# Patient Record
Sex: Male | Born: 1960 | Race: White | Marital: Married | Smoking: Never smoker
Health system: Northeastern US, Community
[De-identification: ages and names within clinical notes are randomized; demographics above are authoritative.]

## PROBLEM LIST (undated history)

## (undated) HISTORY — PX: VARICOSE VEIN SURGERY: SHX832

## (undated) HISTORY — PX: CYSTOSCOPY W/ LASER LITHOTRIPSY: SHX1425

---

## 2020-02-19 ENCOUNTER — Encounter (HOSPITAL_BASED_OUTPATIENT_CLINIC_OR_DEPARTMENT_OTHER): Payer: Self-pay | Admitting: Internal Medicine

## 2020-02-19 ENCOUNTER — Ambulatory Visit: Payer: No Typology Code available for payment source | Attending: Internal Medicine | Admitting: Internal Medicine

## 2020-02-19 DIAGNOSIS — N23 Unspecified renal colic: Secondary | ICD-10-CM | POA: Insufficient documentation

## 2020-02-19 DIAGNOSIS — L989 Disorder of the skin and subcutaneous tissue, unspecified: Secondary | ICD-10-CM

## 2020-02-19 DIAGNOSIS — R011 Cardiac murmur, unspecified: Secondary | ICD-10-CM | POA: Insufficient documentation

## 2020-02-19 DIAGNOSIS — H5213 Myopia, bilateral: Secondary | ICD-10-CM | POA: Diagnosis not present

## 2020-02-19 DIAGNOSIS — Z0000 Encounter for general adult medical examination without abnormal findings: Secondary | ICD-10-CM | POA: Diagnosis not present

## 2020-02-19 NOTE — Assessment & Plan Note (Signed)
-   bring in for PE

## 2020-02-19 NOTE — Assessment & Plan Note (Signed)
-   will bring in for PE

## 2020-02-19 NOTE — Progress Notes (Signed)
PRIMARY CARE NOTE - NEW PATIENT      SUBJECTIVE  Joseph Garrison is a 59 year old [place] ([place])-speaking male patient who presents for a new patient visit. Today we discussed the following issue(s):    Problem List     Skin lesion     02/19/2020: lesion on arm and eyelid. First noticed last year. Eyelid lesions with "a bit of yellow flesh in there". One lesion has shrunk, the other lesions are not changing. Has family history of skin cancer.            Kidney pain     02/19/2020: h/o kidney stones. Pain right flank for the past 5 years. No radiation. Saw urologist in [place]. Had stone removal procedure 3 years ago. Improves with drinking water--drinks 3L per day. No gross hematuria.            Heart murmur     02/19/2020: noted in childhood. Brother and daughter have both been noted to have heart murmurs. No difficulty with exercise.            Health maintenance     Immunizations: will bring in shot records  Reproductive health: not discussed  Routine screening examinations:   - Blood pressure: check at PE    - HIV: not discussed   - Gonorrhea / chlamydia: not discussed   - Cardiovascular risk: will screen for diabetes and assess lipids   - Dyslipidemia: check lipids   - Colon cancer screening: DUE      - abdominal aortic aneurysm: (one time [place] in men age 57-75 who have ever smoked; selective screening in non-smokers)   - lung cancer: (annual low-dose CT in adults 76-80 with >30 pkyrs with use in the last 15 years)                 Review of systems: Pertinent symptoms as noted above, all other systems negative.    Active medical issues:  Patient Active Problem List:     Health maintenance     Skin lesion     Kidney pain     Heart murmur      Medications:  No current outpatient medications on file.    Allergies:  Review of Patient's Allergies indicates:  Not on File    Surgical history:  Past Surgical History:  No date: CYSTOSCOPY W/ LASER LITHOTRIPSY  No date: VARICOSE VEIN SURGERY    Family history:  Review  of patient's family history indicates:  Problem: Cancer - Breast      Relation: Sister          Age of Onset: 36  Problem: Cancer - Other      Relation: Sister          Age of Onset: (Not Specified)          Comment: skin  Problem: Cancer - Other      Relation: Father          Age of Onset: (Not Specified)          Comment: lymphona died age 72  Problem: Cancer - Breast      Relation: Sister          Age of Onset: 50  Problem: Cancer - Breast      Relation: Sister          Age of Onset: 50      Social history:  Social History     Socioeconomic History    Marital status:  Married     Spouse name: Not on file    Number of children: Not on file    Years of education: Not on file    Highest education level: Not on file   Occupational History    Not on file   Tobacco Use    Smoking status: Not on file   Substance and Sexual Activity    Alcohol use: Not on file    Drug use: Not on file    Sexual activity: Not on file   Other Topics Concern    Not on file   Social History Narrative    Not on file   Social Determinants of Health  Financial Resource Strain:     Difficulty of Paying Living Expenses:   Food Insecurity:     Worried About [place] in the Last Year:     [place] in the Last Year:   Transportation Needs:     Film/video editor (Medical):     Lack of Transportation (Non-Medical):   Physical Activity:     Days of Exercise per Week:     Minutes of Exercise per Session:   Stress:     Feeling of Stress :   Social Connections:     Frequency of Communication with Friends and Family:     Frequency of Social Gatherings with Friends and Family:     Attends Religious Services:     Active Member of Clubs or Organizations:     Attends Music therapist:     Marital Status:   Intimate Partner Violence:     Fear of Current or Ex-Partner:     Emotionally Abused:     Physically Abused:     Sexually Abused:       OBJECTIVE  Limited exam for this  televisit. Patient in no distress, speaking in full sentences. Alert and oriented.       ASSESSMENT AND PLAN  Joseph Garrison is a 59 year old male who presents with:     Problem List Items Addressed This Visit     Skin lesion     - will bring in for PE         Kidney pain     - refer to urology         Relevant Orders    REFERRAL TO UROLOGY ( INT) (Completed)    Heart murmur     - will bring in for PE. Consider echo         Health maintenance     - bring in for PE           Other Visit Diagnoses     Myopia of both eyes        Relevant Orders    REFERRAL TO OPTOMETRY ( INT) (Completed)          Orders Placed This Encounter      REFERRAL TO UROLOGY ( INT)          Referral Priority:Routine          Referral Type:Consult and Treat          Referral Reason:Specialty Services Required          Number of Visits Requested:6      REFERRAL TO OPTOMETRY ( INT)          Referral Priority:Routine          Referral Type:Consult and Treat  Referral Reason:Specialty Services Required          Number of Visits Requested:6      Follow up: next available PE    I spent a total of 40 minutes on this visit on the date of service (total time includes all activities performed on the date of service)        I explained the diagnosis and treatment plan, and the patient/parent/guardian expressed understanding of the content. We discussed all medicines/supplements prescribed and the importance of medication adherence. The patient/parent/guardian expressed understanding and no barriers to adherence were identified. Possible side effects of the prescribed medication(s) were explained. I attempted to answer all questions regarding the diagnosis and the proposed treatment.  The patient indicates understanding of these issues and agrees with the plan. Brief care plan is updated and reviewed with the patient.  ____________    Gwynn Burly. Andree Elk, MD MPH   Pager: [phone]  Email: [email or web address]  [facility]

## 2020-02-19 NOTE — Assessment & Plan Note (Signed)
-   refer to urology

## 2020-02-19 NOTE — Assessment & Plan Note (Signed)
-   will bring in for PE. Consider echo

## 2020-02-22 ENCOUNTER — Telehealth (HOSPITAL_BASED_OUTPATIENT_CLINIC_OR_DEPARTMENT_OTHER): Payer: Self-pay

## 2020-02-22 NOTE — Telephone Encounter (Signed)
left message to call office to schedule pe with the pcp

## 2020-03-28 ENCOUNTER — Other Ambulatory Visit: Payer: Self-pay

## 2020-03-29 ENCOUNTER — Encounter (HOSPITAL_BASED_OUTPATIENT_CLINIC_OR_DEPARTMENT_OTHER): Payer: Self-pay | Admitting: Internal Medicine

## 2020-03-29 ENCOUNTER — Telehealth (HOSPITAL_BASED_OUTPATIENT_CLINIC_OR_DEPARTMENT_OTHER): Payer: Self-pay | Admitting: Internal Medicine

## 2020-03-29 ENCOUNTER — Ambulatory Visit: Payer: No Typology Code available for payment source | Attending: Internal Medicine | Admitting: Internal Medicine

## 2020-03-29 VITALS — BP 114/68 | HR 57 | Temp 97.5°F | Ht 65.16 in | Wt 172.0 lb

## 2020-03-29 DIAGNOSIS — L989 Disorder of the skin and subcutaneous tissue, unspecified: Secondary | ICD-10-CM | POA: Insufficient documentation

## 2020-03-29 DIAGNOSIS — R011 Cardiac murmur, unspecified: Secondary | ICD-10-CM | POA: Insufficient documentation

## 2020-03-29 DIAGNOSIS — Z23 Encounter for immunization: Secondary | ICD-10-CM | POA: Diagnosis present

## 2020-03-29 DIAGNOSIS — Z0000 Encounter for general adult medical examination without abnormal findings: Secondary | ICD-10-CM

## 2020-03-29 LAB — CBC, PLATELET & DIFFERENTIAL
ABSOLUTE BASO COUNT: 0.1 10*3/uL (ref 0.0–0.1)
ABSOLUTE EOSINOPHIL COUNT: 0.1 10*3/uL (ref 0.0–0.8)
ABSOLUTE IMM GRAN COUNT: 0.02 10*3/uL (ref 0.00–0.03)
ABSOLUTE LYMPH COUNT: 1.9 10*3/uL (ref 0.6–5.9)
ABSOLUTE MONO COUNT: 0.5 10*3/uL (ref 0.2–1.4)
ABSOLUTE NEUTROPHIL COUNT: 3.9 10*3/uL (ref 1.6–8.3)
ABSOLUTE NRBC COUNT: 0 10*3/uL (ref 0.0–0.0)
BASOPHIL %: 0.9 % (ref 0.0–1.2)
EOSINOPHIL %: 1.8 % (ref 0.0–7.0)
HEMATOCRIT: 46.7 % (ref 40.1–51.0)
HEMOGLOBIN: 15.1 g/dL (ref 13.7–17.5)
IMMATURE GRANULOCYTE %: 0.3 % (ref 0.0–0.4)
LYMPHOCYTE %: 29.4 % (ref 15.0–54.0)
MEAN CORP HGB CONC: 32.3 g/dL (ref 31.0–37.0)
MEAN CORPUSCULAR HGB: 30.6 pg (ref 26.0–34.0)
MEAN CORPUSCULAR VOL: 94.5 fl (ref 80.0–100.0)
MEAN PLATELET VOLUME: 10.4 fL (ref 8.7–12.5)
MONOCYTE %: 8.3 % (ref 4.0–13.0)
NEUTROPHIL %: 59.3 % (ref 40.0–75.0)
NRBC %: 0 % (ref 0.0–0.0)
PLATELET COUNT: 288 10*3/uL (ref 150–400)
RBC DISTRIBUTION WIDTH STD DEV: 44.1 fL (ref 35.1–46.3)
RED BLOOD CELL COUNT: 4.94 M/uL (ref 4.60–6.10)
WHITE BLOOD CELL COUNT: 6.5 10*3/uL (ref 4.0–11.0)

## 2020-03-29 NOTE — Telephone Encounter (Signed)
Nurse from [place] called the [facility] to complete a benefit analysis for the Tdap Vaccine.    The vaccine is covered under the patients [employer] medical coverage.    Please choose Private

## 2020-03-29 NOTE — Assessment & Plan Note (Signed)
-   will check a1c, lipids, as well as new immigrant screening labs  - flu, shinrix, and tdap

## 2020-03-29 NOTE — Addendum Note (Signed)
Addended by: Derinda Sis on: 03/29/2020 10:15 AM     Modules accepted: Orders, SmartSet

## 2020-03-29 NOTE — Progress Notes (Signed)
PRIMARY CARE NOTE - ANNUAL PHYSICAL      SUBJECTIVE  Joseph Garrison is a 59 year old [place] ([place]ian)-speaking male patient who presents for an annual physical. Today we discussed the following issue(s):    Problem List     Skin lesion     02/19/2020: lesion on arm and eyelid. First noticed last year. Eyelid lesions with "a bit of yellow flesh in there". One lesion has shrunk, the other lesions are not changing. Has family history of skin cancer.            Heart murmur     02/19/2020: noted in childhood. Brother and daughter have both been noted to have heart murmurs. No difficulty with exercise.            Health maintenance - Primary     Immunizations: will bring in shot records  Reproductive health: not discussed  Routine screening examinations:   - Blood pressure: check at PE    - HIV: not discussed   - Gonorrhea / chlamydia: not discussed   - Cardiovascular risk: will screen for diabetes and assess lipids   - Dyslipidemia: check lipids   - Colon cancer screening: DUE      - abdominal aortic aneurysm: (one time [place] in men age 26-75 who have ever smoked; selective screening in non-smokers)   - lung cancer: (annual low-dose CT in adults 35-80 with >30 pkyrs with use in the last 15 years)                 Review of systems: Pertinent symptoms as noted above, all other systems negative.    Active medical issues:  Patient Active Problem List:     Health maintenance     Skin lesion     Kidney pain     Heart murmur      Medications:  No current outpatient medications on file.    Allergies:  Review of Patient's Allergies indicates:  No Known Allergies    Surgical history:  Past Surgical History:  No date: CYSTOSCOPY W/ LASER LITHOTRIPSY  No date: VARICOSE VEIN SURGERY    Family history:  Review of patient's family history indicates:  Problem: Cancer - Breast      Relation: Sister          Age of Onset: 77  Problem: Cancer - Other      Relation: Sister          Age of Onset: (Not Specified)          Comment:  skin  Problem: Cancer - Other      Relation: Father          Age of Onset: (Not Specified)          Comment: lymphona died age 80  Problem: Cancer - Breast      Relation: Sister          Age of Onset: 12  Problem: Cancer - Breast      Relation: Sister          Age of Onset: 56      Social history:  Social History     Socioeconomic History    Marital status: Married     Spouse name: Not on file    Number of children: Not on file    Years of education: Not on file    Highest education level: Not on file   Occupational History    Not on file   Tobacco  Use    Smoking status: Never Smoker    Smokeless tobacco: Never Used   Substance and Sexual Activity    Alcohol use: Not on file    Drug use: Not on file    Sexual activity: Not on file   Other Topics Concern    Not on file   Social History Narrative    03/29/2020: came to [place] in 06/2018. Did hepatitis, HIV, and Tb testing in 2019. Came to [place] to study [place]. Would like to study theology along with psychology. 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 Masters in Musician sexuality. Catholic religion. Will be in [place] temporarily. 3 children, Barbette Hair, Jennye Moccasin, [place] Wife Ellard Artis.    Social Determinants of Health  Financial Resource Strain:     Difficulty of Paying Living Expenses: Not on file  Food Insecurity:     Worried About Charity fundraiser in the Last Year: Not on file    [employer] of Food in the Last Year: Not on file  Transportation Needs:     Lack of Transportation (Medical): Not on file    Lack of Transportation (Non-Medical): Not on file  Physical Activity:     Days of Exercise per Week: Not on file    Minutes of Exercise per Session: Not on file  Stress:     Feeling of Stress : Not on file  Social Connections:     Frequency of Communication with Friends and Family: Not on file    Frequency of Social Gatherings with Friends and Family: Not on file    Attends Religious Services: Not on file    Active Member  of [place] or Organizations: Not on file    Attends Archivist Meetings: Not on file    Marital Status: Not on file  Intimate Partner Violence:     Fear of Current or Ex-Partner: Not on file    Emotionally Abused: Not on file    Physically Abused: Not on file    Sexually Abused: Not on file      OBJECTIVE  BP 114/68    Pulse 57    Temp 97.5 F (36.4 C) (Temporal)    Ht 5' 5.16" (1.655 m)    Wt 78 kg (172 lb)    SpO2 97%    BMI 28.48 kg/m   General: well developed, well nourished, no apparent distress, normally interactive  HEENT: Normocephalic, atraumatic, extraocular motions intact  Neck: no LAD, normal range of motion  CV: regular rate and rhythm, no murmurs, rubs, or gallops  Pulm: normal work of breathing, clear to ausculation bilaterally, no wheezes, rales, or rhonchi  Abdomen: soft, non-tender, non-distended, no hepatosplenomegaly  Neuro: normal strength and tone throughout, normal gait  Skin: 44mm flesh colored smooth and hard skin nodule on left upper arm  Psych: affect normal, mood normal, thought content normal      ASSESSMENT AND PLAN  Joseph JARRAH BABICH is a 59 year old male who presents with:     Problem List Items Addressed This Visit     Skin lesion     - refer to derm         Relevant Orders    REFERRAL TO DERMATOLOGY ( INT) (Completed)    Heart murmur     Not noted today on exam.   - monitor for symptoms         Health maintenance - Primary     -  will check a1c, lipids, as well as new immigrant screening labs  - flu, shinrix, and tdap         Relevant Orders    HIV ANTIGEN ANTIBODY 5TH GEN    HEMOGLOBIN A1C    LIPID PANEL    HEPATITIS C ANTIBODY    QUANTIFERON-TB GOLD PLUS    HEPATIC FUNCTION PANEL    BASIC METABOLIC PANEL    CBC, PLATELET & DIFFERENTIAL    HEPATITIS B CORE ANTIBODY    HEPATITIS B SURFACE AB QUANT    HEPATITIS B SURFACE ANTIGEN    PROSTATIC SPECIFIC ANTIGEN          Orders Placed This Encounter      HIV Antigen Antibody 5th Gen          Order Specific  Question: PRIOR TO ORDERING, was verbal consent obtained?  (Verbal consent by the patient or designee is REQUIRED to order this test.)          Answer: YES          Order Specific Question: Release result to patient via [facility]          Answer: Immediate      Hemoglobin A1c          Order Specific Question: Release result to patient via [place]          Answer: Immediate      Lipid Panel          Order Specific Question: Release result to patient via [employer]          Answer: Immediate      Hepatitis C Antibody          Order Specific Question: Release result to patient via [employer]          Answer: Immediate      Quantiferon-TB Gold Plus          Order Specific Question: Release result to patient via [place]          Answer: Immediate      Hepatic Function Panel          Order Specific Question: Release result to patient via [facility]          Answer: Immediate      Basic Metabolic Panel          Order Specific Question: Release result to patient via [facility]          Answer: Immediate      CBC, Platelet & Differential          Order Specific Question: Release result to patient via [employer]          Answer: Immediate      Hepatitis B Core Antibody          Order Specific Question: Release result to patient via [employer]          Answer: Immediate      Hepatitis B Surface Ab Quant          Order Specific Question: Release result to patient via [facility]          Answer: Immediate      Hepatitis B Surface Antigen          Order Specific Question: Release result to patient via [employer]          Answer: Immediate      Prostatic Specific Antigen          Order Specific Question: Release result to patient via [place]  Answer: Immediate      REFERRAL TO DERMATOLOGY (INT)          Referral Priority:Routine          Referral Type:Consult and Treat          Referral Reason:Specialty Services Required          Number of Visits Requested:6      Follow up: 1 year or sooner as needed    I explained the diagnosis and treatment plan,  and the patient/parent/guardian expressed understanding of the content. We discussed all medicines/supplements prescribed and the importance of medication adherence. The patient/parent/guardian expressed understanding and no barriers to adherence were identified. Possible side effects of the prescribed medication(s) were explained. I attempted to answer all questions regarding the diagnosis and the proposed treatment.  The patient indicates understanding of these issues and agrees with the plan. Brief care plan is updated and reviewed with the patient.  ____________    Gwynn Burly. Andree Elk, MD MPH   Pager: [phone]  Email: [email or web address]  [facility]

## 2020-03-29 NOTE — Assessment & Plan Note (Signed)
refer to derm

## 2020-03-29 NOTE — Progress Notes (Signed)
03/29/2020  VIS given prior to administration and reviewed with the patient and or legal guardian. Patient understands the disease and the vaccine. See immunization/Injection module or chart review for date of publication and additional information.  Randel Pigg, LPN

## 2020-03-29 NOTE — Assessment & Plan Note (Signed)
Not noted today on exam.   - monitor for symptoms

## 2020-03-30 LAB — HIV ANTIGEN ANTIBODY 5TH GEN: HIVAGAB QUALITATIVE: NONREACTIVE

## 2020-03-30 LAB — BASIC METABOLIC PANEL
ANION GAP: 7 mmol/L (ref 5–15)
BUN (UREA NITROGEN): 18 mg/dL (ref 7–18)
CALCIUM: 9.5 mg/dL (ref 8.5–10.1)
CARBON DIOXIDE: 27 mmol/L (ref 21–32)
CHLORIDE: 104 mmol/L (ref 98–107)
CREATININE: 1.2 mg/dL (ref 0.7–1.2)
ESTIMATED GLOMERULAR FILT RATE: >60 mL/min (ref >60)
Glucose Random: 108 mg/dL (ref 74–160)
POTASSIUM: 4.7 mmol/L (ref 3.5–5.1)
SODIUM: 138 mmol/L (ref 136–145)

## 2020-03-30 LAB — HEPATIC FUNCTION PANEL
ALANINE AMINOTRANSFERASE: 28 U/L (ref 12–45)
ALBUMIN: 3.8 g/dL (ref 3.4–5.0)
ALKALINE PHOSPHATASE: 81 U/L (ref 45–117)
ASPARTATE AMINOTRANSFERASE: 21 U/L (ref 8–34)
BILIRUBIN DIRECT: 0.2 mg/dl (ref 0.0–0.2)
BILIRUBIN TOTAL: 0.7 mg/dL (ref 0.2–1.0)
INDIRECT BILIRUBIN: 0.5 mg/dL (ref 0.2–0.9)
TOTAL PROTEIN: 7.5 g/dL (ref 6.4–8.2)

## 2020-03-30 LAB — LIPID PANEL
Cholesterol: 239 mg/dL (ref 0–239)
HIGH DENSITY LIPOPROTEIN: 57 mg/dL (ref 40–?)
LOW DENSITY LIPOPROTEIN DIRECT: 155 mg/dL (ref 0–189)
TRIGLYCERIDES: 143 mg/dL (ref 0–150)

## 2020-03-30 LAB — HEPATITIS B SURFACE ANTIGEN: HEPATITIS B SURFACE ANTIGEN: NONREACTIVE

## 2020-03-30 LAB — HEPATITIS B SURFACE AB QUANT: HEPATITIS B SURFACE AB QUANT: 733 m[IU]/mL (ref 0.00–9.99)

## 2020-03-30 LAB — PROSTATIC SPECIFIC ANTIGEN: PROSTATIC SPECIFIC ANTIGEN: 0.512 ng/mL (ref 0.000–4.000)

## 2020-03-30 LAB — HEPATITIS B CORE ANTIBODY: HEPATITIS B CORE ANTIBODY: NONREACTIVE

## 2020-03-30 LAB — HEPATITIS C ANTIBODY: HEPATITIS C ANTIBODY: NONREACTIVE

## 2020-03-31 LAB — QUANTIFERON-TB GOLD PLUS
QFT PLUS INTERPRETATION: NEGATIVE
QFT PLUS MITOGEN MINUS NIL: 9.905 IU/mL
QFT PLUS NIL VALUE: 0.095 IU/mL
QFT PLUS TB AG1 MINUS NIL: -0.018 IU/mL
QFT PLUS TB AG2 MINUS NIL: -0.022 IU/mL

## 2020-04-01 LAB — HEMOGLOBIN A1C
ESTIMATED AVERAGE GLUCOSE: 117 mg/dL (ref 74–160)
HEMOGLOBIN A1C: 5.7 % — ABNORMAL HIGH (ref 4.0–5.6)

## 2020-05-20 ENCOUNTER — Other Ambulatory Visit: Payer: Self-pay

## 2020-05-20 ENCOUNTER — Ambulatory Visit: Payer: No Typology Code available for payment source | Attending: Specialist | Admitting: Specialist

## 2020-05-20 ENCOUNTER — Encounter (HOSPITAL_BASED_OUTPATIENT_CLINIC_OR_DEPARTMENT_OTHER): Payer: Self-pay | Admitting: Specialist

## 2020-05-20 DIAGNOSIS — N23 Unspecified renal colic: Secondary | ICD-10-CM | POA: Insufficient documentation

## 2020-05-20 DIAGNOSIS — R10A0 Flank pain, unspecified side: Secondary | ICD-10-CM

## 2020-05-20 DIAGNOSIS — R109 Unspecified abdominal pain: Secondary | ICD-10-CM

## 2020-05-20 DIAGNOSIS — R350 Frequency of micturition: Secondary | ICD-10-CM | POA: Insufficient documentation

## 2020-05-20 LAB — POC URINALYSIS
BILIRUBIN, URINE: NEGATIVE
GLUCOSE,URINE: NEGATIVE
KETONE, URINE: NEGATIVE
LEUKOCYTE ESTERASE: NEGATIVE
NITRITE, URINE: NEGATIVE
OCCULT BLOOD, URINE: NEGATIVE
PH URINE: 5.5 (ref 5.0–8.0)
PROTEIN, URINE: NEGATIVE
SPECIFIC GRAVITY, URINE: >=1.03 (ref 1.003–1.030)
UROBILINOGEN URINE: 0.2 (ref 0.2–1.0)

## 2020-05-20 LAB — MEAS POST-VOIDING RESIDUAL URINE&/BLADDER CAP: Post Void Residual: 7 (ref 0–0)

## 2020-05-20 NOTE — Progress Notes (Signed)
CC: right back pain    This is a 59 year old male patient of Ralene Ok, MD who has been referred today  in consultation for right back pain.     To characterize the patient's chief complaint:    He notes chronic right back pain. Happens 2x/wk. When he does not drink  Enough water, there is more pain. When he drinks good amount of water, does not have pain.    No change in pain with movement, activity, burning.    Minimal urinary freq    NO dysuria.  NO hematuria.    Drinks: 4 bottles water/d  1 cup coffee/d    + kidney stones.  1 laser URS+stent in [place]. Does not remember which side.    No fevers/chills.  No nausea/emesis.        History reviewed. No pertinent past medical history.    Past Surgical History:  No date: CYSTOSCOPY W/ LASER LITHOTRIPSY  No date: VARICOSE VEIN SURGERY    No current outpatient medications on file prior to visit.  No current facility-administered medications on file prior to visit.      Review of Patient's Allergies indicates:  No Known Allergies    Family History:  No history of kidney cancer.  No history of bladder cancer.  No history of prostate cancer.    Social History     Socioeconomic History    Marital status: Married     Spouse name: Not on file    Number of children: Not on file    Years of education: Not on file    Highest education level: Not on file   Occupational History    Not on file   Tobacco Use    Smoking status: Never Smoker    Smokeless tobacco: Never Used   Substance and Sexual Activity    Alcohol use: Not on file    Drug use: Not on file    Sexual activity: Not on file   Other Topics Concern    Not on file   Social History Narrative    03/29/2020: came to [place] in 06/2018. Did hepatitis, HIV, and Tb testing in 2019. Came to [place] to study [place]. Would like to study theology along with psychology. 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 Masters in Musician sexuality. Catholic religion. Will be in [place] temporarily. 3 children, Barbette Hair, Jennye Moccasin, [place] Wife Ellard Artis.    Social Determinants of Health  Financial Resource Strain:     Difficulty of Paying Living Expenses: Not on file  Food Insecurity:     Worried About Charity fundraiser in the Last Year: Not on file    [employer] of Food in the Last Year: Not on file  Transportation Needs:     Lack of Transportation (Medical): Not on file    Lack of Transportation (Non-Medical): Not on file  Physical Activity:     Days of Exercise per Week: Not on file    Minutes of Exercise per Session: Not on file  Stress:     Feeling of Stress : Not on file  Social Connections:     Frequency of Communication with Friends and Family: Not on file    Frequency of Social Gatherings with Friends and Family: Not on file    Attends Religious Services: Not on file    Active Member of Clubs or Organizations: Not on file    Attends Archivist Meetings:  Not on file    Marital Status: Not on file  Intimate Partner Violence:     Fear of Current or Ex-Partner: Not on file    Emotionally Abused: Not on file    Physically Abused: Not on file    Sexually Abused: Not on file    Social history has been reviewed in the chart, and no new changes are noted.      REVIEW OF SYSTEMS:  CONSTITUTIONAL: Negative for any fever, chills, weight loss or weight gain.  EYES: No eye pain, eye redness  ENT: No difficulty swallowing. No nose bleeds. No sore throat. No ear ache.  CARDIOVASCULAR: No chest pain. No palpitation. No fainting.  RESPIRATORY: No SOB. No wheezing. No chest tightness. No cough.  GASTROINTESTINAL: No vomiting. No constipation. No diarrhea.  GENITOURINARY: Voiding as above in HPI.  NEUROLOGIC: No mental status changes or visual changes. No seizures or paresthesias.   ENDOCRINE: No excessive thirst.  HEMATOLOGIC: No excessive bruising. No excessive bleeding.  SKIN: No itchiness. No rashes.  PSYCHIATRIC: no anxiety. No hallucinations. No depression.   MUSCULOSKETAL: no joint  swelling. Normal gait.         PHYSICAL EXAMINATION:   GENERAL: The patient is healthy-appearing. Well developed. Well nourished. No acute distress.   SKIN: No rashes. No lesions.  HEENT: Atraumatic, normocephalic. Tongue midline. Conjunctivae and sclerae are clear. Extraocular movements are full. Oral mucosa normal. Pharynx is negative.  NECK: Supple. There is no lymphadenopathy. No masses are noted. Trachea is midline.   LUNGS: Normal respiratory effort. No wheezing.  CARDIOVASCULAR: Regular pulses in upper extremities.   ABDOMEN: No organomegaly. No masses. No tenderness. No CVA tenderness.  NEUROLOGICAL: Alert and oriented X 3. Good gait and balance.  PSYCH: Mood stable. Affect normal.        LABS reviewed:   Ref Range & Units 05/20/20 0921   COLOR YELLOW YELLOW    CLARITY CLEAR CLEAR    GLUCOSE,URINE NEGATIVE NEGATIVE    BILIRUBIN, URINE NEGATIVE NEGATIVE    KETONE, URINE NEGATIVE NEGATIVE    SPECIFIC GRAVITY, URINE 1.003 - 1.030 >=1.030    UROBILINOGEN URINE 0.2 - 1.0 0.2    OCCULT BLOOD, URINE NEGATIVE NEGATIVE    PH URINE 5.0 - 8.0 5.5    PROTEIN, URINE NEGATIVE NEGATIVE    NITRITE, URINE NEGATIVE NEGATIVE    LEUKOCYTE ESTERASE NEGATIVE NEGATIVE          PROSTATIC SPECIFIC ANTIGEN (ng/mL)   Date Value   03/29/2020 0.512     No results found for: PSATOTAL  No results found for: PERCTFREEPSA      CREATININE (mg/dL)   Date Value   03/29/2020 1.2           STUDIES today:  PVR 7 cc        59 year old male patient seen today for chronic right back pain.  Hx kidney stone requiring surgery    Check CT stone    Advised on increasing fluid intake    RTC after CT

## 2020-05-23 ENCOUNTER — Ambulatory Visit
Admission: RE | Admit: 2020-05-23 | Discharge: 2020-05-23 | Disposition: A | Discharge status: Home / Self Care | Payer: No Typology Code available for payment source | Attending: Specialist | Admitting: Specialist

## 2020-05-23 ENCOUNTER — Other Ambulatory Visit: Payer: Self-pay

## 2020-05-23 DIAGNOSIS — R350 Frequency of micturition: Secondary | ICD-10-CM

## 2020-05-23 DIAGNOSIS — N3289 Other specified disorders of bladder: Secondary | ICD-10-CM | POA: Diagnosis present

## 2020-05-23 DIAGNOSIS — D1771 Benign lipomatous neoplasm of kidney: Secondary | ICD-10-CM | POA: Diagnosis present

## 2020-05-23 DIAGNOSIS — R109 Unspecified abdominal pain: Secondary | ICD-10-CM

## 2020-05-23 DIAGNOSIS — R10A0 Flank pain, unspecified side: Secondary | ICD-10-CM

## 2020-05-23 DIAGNOSIS — N23 Unspecified renal colic: Secondary | ICD-10-CM

## 2020-05-30 ENCOUNTER — Other Ambulatory Visit: Payer: Self-pay

## 2020-05-30 ENCOUNTER — Encounter (HOSPITAL_BASED_OUTPATIENT_CLINIC_OR_DEPARTMENT_OTHER): Payer: Self-pay | Admitting: Registered Nurse

## 2020-05-30 ENCOUNTER — Ambulatory Visit: Payer: No Typology Code available for payment source | Attending: Internal Medicine | Admitting: Registered Nurse

## 2020-05-30 DIAGNOSIS — Z23 Encounter for immunization: Secondary | ICD-10-CM

## 2020-05-30 NOTE — Progress Notes (Signed)
Zoster Vaccine  May 30, 2020    1. Are you 57 years of age and older? Yes    2. Do you have any allergies to medications, gelatin, neomycin, or any other component of the vaccine? If yes list allergies... no    3. Have you received Pneumovax 23 in the past month? no    4. Have you received any other vaccines in the last 4 weeks? no    5. Have you ever had a serious reaction to vaccines in the past? no    6. Are you currently taking chronic acyclovir, famciclovir or valacyclovir?  no    7. Are you being treated with immunosuppressive therapy? no    8. Do you have a history of primary or acquired immunodeficiencies? no    9. For Women: Are you pregnant or considering becoming pregnant within the next month? No  Worthy Keeler, RN, 05/30/2020      Immunization information reviewed. Current VIS reviewed and given to patient/ guardian. Verbal assent obtained from patient/ guardian.  See immunization/Injection module or chart review for date of publication and additional information. Verbal assent obtained from patient/guardian. Comfort measures for possible side effects reviewed.

## 2020-06-15 ENCOUNTER — Ambulatory Visit (HOSPITAL_BASED_OUTPATIENT_CLINIC_OR_DEPARTMENT_OTHER): Payer: No Typology Code available for payment source | Admitting: Specialist

## 2020-07-13 ENCOUNTER — Other Ambulatory Visit: Payer: Self-pay

## 2020-07-13 ENCOUNTER — Encounter (HOSPITAL_BASED_OUTPATIENT_CLINIC_OR_DEPARTMENT_OTHER): Payer: Self-pay | Admitting: Specialist

## 2020-07-13 ENCOUNTER — Ambulatory Visit: Payer: No Typology Code available for payment source | Attending: Specialist | Admitting: Specialist

## 2020-07-13 VITALS — BP 118/64 | HR 78

## 2020-07-13 DIAGNOSIS — N200 Calculus of kidney: Secondary | ICD-10-CM

## 2020-07-13 DIAGNOSIS — D1771 Benign lipomatous neoplasm of kidney: Secondary | ICD-10-CM | POA: Diagnosis present

## 2020-07-13 DIAGNOSIS — N3289 Other specified disorders of bladder: Secondary | ICD-10-CM

## 2020-07-13 LAB — POC URINALYSIS
BILIRUBIN, URINE: NEGATIVE
GLUCOSE,URINE: NEGATIVE
KETONE, URINE: NEGATIVE
LEUKOCYTE ESTERASE: NEGATIVE
NITRITE, URINE: NEGATIVE
OCCULT BLOOD, URINE: NEGATIVE
PH URINE: 5.5 (ref 5.0–8.0)
PROTEIN, URINE: NEGATIVE
SPECIFIC GRAVITY, URINE: >=1.03 (ref 1.003–1.030)
UROBILINOGEN URINE: 0.2 (ref 0.2–1.0)
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
## 2020-07-13 NOTE — Progress Notes (Signed)
Pt. Tolerated cystoscopy procedure well. No c/o pain. No evidence of bleeding. Post-procedure instructions discussed with pt.

## 2020-07-13 NOTE — Patient Instructions (Signed)
Patient Education     Cistoscopia  Cystoscopy  A cistoscopia é um procedimento usado para diagnosticar e, às vezes, tratar quadros clínicos que afetam o trato urinário inferior. O trato urinário inferior inclui a bexiga e a uretra. A uretra é o tubo que drena a urina para fora da bexiga. A cistoscopia é feita usando um instrumento fino em forma de tubo com uma luz e uma câmera na ponta (cistoscópio). O cistoscópio pode ser duro ou flexível, dependendo do objetivo do procedimento. O cistoscópio é inserido na bexiga através da uretra.  Uma cistoscopia pode ser recomendada se você apresentar:  · Infecções do trato urinário que sempre se repetem.  · Sangue na urina (hematúria).  · Incapacidade de segurar a urina (incontinência urinária) ou uma bexiga hiperativa.  · Células incomuns encontradas em uma amostra de urina.  · Um bloqueio da uretra, como um cálculo urinário.  · Dor ao urinar.  · Uma anormalidade da bexiga encontrada durante um pielograma intravenoso (PIV) ou um exame de TC (tomografia computadorizada).  Uma cistoscopia também pode ser realizada para remover uma amostra de tecido para exame ao microscópio (biópsia).  Informe o médico sobre:  · Quaisquer alergias.  · Todos os medicamentos que estiver usando, incluindo vitaminas, fitoterápicos, colírios, cremes e medicamentos de venda sem receita médica.  · Quaisquer problemas que você ou parentes seus tiverem experimentado com anestésicos.  · Quaisquer doenças sanguíneas.  · Cirurgias anteriores.  · Quaisquer quadros clínicos.  · Gravidez ou possibilidade de gravidez.  Quais são os riscos?  Esse procedimento geralmente é seguro. Mas problemas podem ocorrer, incluindo:  · Infecção.  · Hemorragia.  · Reações alérgicas a medicamentos.  · Danos a outras estruturas ou órgãos.  O que acontece antes do procedimento?  · Pergunte ao seu médico sobre:  ? Alteração ou interrupção dos seus medicamentos regulares. Isso será especialmente importante se você estiver tomando  medicamentos para diabetes ou anticoagulantes.  ? Uso de medicamentos como aspirina e ibuprofeno. Esses medicamentos podem afinar seu sangue. Não tome esses medicamentos a menos que seu médico permita.  ? O uso de medicamentos, vitaminas, fitoterápicos e suplementos vendidos sem receita médica.  · Siga as instruções do seu médico no que se refere a restrições de alimentos ou bebidas.  · Pergunte ao seu médico quais medidas serão tomadas para prevenir infecções. Isso pode incluir:  ? Lavar a pele com um sabão que mata os germes.  ? Tomar antibiótico.  · Você pode fazer um exame ou teste como:  ? Radiografias da bexiga, uretra ou rins.  ? Exames de urina para verificar se há sinais de infecção.  · Planeje para que alguém leve você para casa do hospital ou clínica.  O que acontece durante o procedimento?    · Você receberá um ou mais dos seguintes itens:  ? Um medicamento para ajudar você a relaxar (sedativo).  ? Um medicamento para entorpecer a área (anestesia local).  · Será feita limpeza da área ao redor da abertura da sua uretra.  · O cistoscópio será inserido na sua bexiga através da uretra.  · Um líquido sem germes (estéril) fluirá através do cistoscópio para encher sua bexiga. O líquido vai inflar sua bexiga para que o médico consiga examinar as paredes dela com clareza.  · Seu médico examinará a uretra e a bexiga. Seu médico pode fazer uma biópsia ou remover cálculos.  · O cistoscópio será removido, e sua bexiga será esvaziada.  O procedimento pode variar entre médicos e hospitais.    O que posso esperar após o procedimento?  Após o procedimento, é comum que ocorra:  · Algum desconforto ou dor no abdome e na uretra.  · Sintomas urinários. Isso inclui:  ? Dor leve ou sensação de queimação ao urinar. A dor deve parar em alguns minutos após urinar. Isso pode durar até 1 semana.  ? Uma pequena quantidade de sangue em sua urina por alguns dias.  ? Sensação de querer urinar, mas produzir somente uma pequena quantidade  de urina.  Siga essas instruções em casa:  Medicamentos  · Tome medicamentos vendidos com ou sem receita médica somente de acordo com as indicações do seu médico.  · Caso tenha recebido uma prescrição de antibiótico, tome-o somente conforme a orientação do seu médico. Não pare de tomar o antibiótico mesmo se começar a se sentir melhor.  Instruções gerais  · Retorne a suas atividades normais de acordo com as orientações do seu médico. Pergunte ao seu médico quais atividades são seguras para você.  · Não dirija por 24 horas caso tenha recebido um sedativo durante o procedimento.  · Fique atento para qualquer traço de sangue em sua urina. Se a quantidade de sangue em sua urina aumentar, ligue para seu médico.  · Siga as instruções do seu médico no que se refere a restrições de alimentos ou bebidas.  · Se uma amostra de tecido foi coletada para exame (biópsia) durante o procedimento, você é quem deve retirar os resultados. Pergunte ao médico ou ao departamento que realizou o exame quando os resultados estarão prontos.  · Beba líquidos em quantidade suficiente para manter a urina na cor amarelo-pálida.  · Compareça a todas as consultas de acompanhamento de acordo com as orientações do seu médico. Isso é importante.  Entre em contato com um médico se você:  · Sentir dor que piora ou não melhora com medicamentos, especialmente dor ao urinar.  · Tiver dificuldade para urinar.  · Surgir mais sangue na sua urina.  Busque ajuda imediatamente se você:  · Notar coágulos de sangue em sua urina.  · Tiver dor na barriga.  · Tiver febre ou calafrios.  · Não conseguir urinar.  Resumo  · A cistoscopia é um procedimento usado para diagnosticar e, às vezes, tratar quadros clínicos que afetam o trato urinário inferior.  · A cistoscopia é feita usando um instrumento fino em forma de tubo com uma luz e uma câmera na ponta.  · Após o procedimento, é comum sentir certa dor ou incômodo no abdome e na uretra.  · Fique atento para qualquer  traço de sangue em sua urina. Se a quantidade de sangue em sua urina aumentar, ligue para seu médico.  · Caso tenha recebido uma prescrição de antibiótico, tome-o somente conforme a orientação do seu médico. Não pare de tomar o antibiótico mesmo se começar a se sentir melhor.  Estas informações não se destinam a substituir as recomendações de seu médico. Não deixe de discutir quaisquer dúvidas com seu médico.  Document Revised: 06/13/2018 Document Reviewed: 06/13/2018  Elsevier Patient Education © 2021 [employer]

## 2020-07-13 NOTE — Progress Notes (Signed)
DATE: 07/13/2020    PROCEDURE: cystoscopy local, flexible    INDICATION/PREOP DX: This is a 60 year old male patient with BLADDER WALL THICKENING    SURGEON: DR Mamoru Takeshita      PROCEDURE  Risks and benefits were reviewed and surgical consent was signed.     The patient was prepped and draped in supine position.  Procedural time-out was performed with consensus on patient and procedure by surgical and nursing staff.   10cc 1% lidocaine jelly was instilled per urethra for local anesthesia.  16 Fr flexible cystoscope was introduced into the urethra and both the urethra and bladder were visually inspected.  Bladder: No striations, trabeculations, saccules, or diverticula.  No stones.  No erythematous areas.  No papillary lesions.  On retroflexion, no lesions of the bladder neck.  Both ureteral orifices were visualized and were without bloody efflux.  Prostate: MOD hyperplasia, W/ evidence of coapting lobes or obstruction.  Verumontanum healthy without evidence of duct obstruction.  Urinary sphincter was intact within Membranous Urethra.  Bulbar and Pendulous urethra without stricture or lesions.  Fossa navicularis without stricture or lesion.  Patient tolerated the procedure well.  Results were discussed.

## 2020-08-29 ENCOUNTER — Encounter (HOSPITAL_BASED_OUTPATIENT_CLINIC_OR_DEPARTMENT_OTHER): Payer: Self-pay | Admitting: Internal Medicine

## 2020-08-29 DIAGNOSIS — M899 Disorder of bone, unspecified: Secondary | ICD-10-CM

## 2020-10-25 ENCOUNTER — Other Ambulatory Visit: Payer: Self-pay

## 2020-10-25 ENCOUNTER — Ambulatory Visit
Admission: RE | Admit: 2020-10-25 | Discharge: 2020-10-25 | Disposition: A | Discharge status: Home / Self Care | Payer: No Typology Code available for payment source | Attending: Physician Assistant | Admitting: Physician Assistant

## 2020-10-25 DIAGNOSIS — M899 Disorder of bone, unspecified: Secondary | ICD-10-CM | POA: Diagnosis present

## 2020-10-25 MED ORDER — GADOTERIDOL 279.3 MG/ML IV SOLN
1.00 mL | Freq: Once | INTRAVENOUS | Status: AC
Start: 2020-10-25 — End: 2020-10-25
  Administered 2020-10-25: 17 mL via INTRAVENOUS

## 2020-11-07 ENCOUNTER — Encounter (HOSPITAL_BASED_OUTPATIENT_CLINIC_OR_DEPARTMENT_OTHER): Payer: Self-pay | Admitting: Internal Medicine

## 2020-11-24 NOTE — Progress Notes (Signed)
Dear RN,    Please:    1. Create Telephone encounter for this patient.  2. Share with the patient the attached results. The bone lesion was seen on MRI. 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    Plan:  1. No further tests need to be done. .    2. Type of Outreach: 1 phone call and if unable to reach send letter    3. Document the conversation in the Telephone Encounter and close the encounter, no need to send back to me.     Thank you,  Joaquin Bend, PA-C

## 2020-11-25 ENCOUNTER — Telehealth (HOSPITAL_BASED_OUTPATIENT_CLINIC_OR_DEPARTMENT_OTHER): Payer: Self-pay

## 2020-11-25 NOTE — Telephone Encounter (Signed)
Patient reached. Discuss lab results based on provider's message. The bone lesion seen on MRI. It looks totally benign, like a fibroma.

## 2020-11-25 NOTE — Telephone Encounter (Signed)
-----   Message from Joaquin Bend, [place] sent at 11/24/2020  4:54 PM EDT -----  Dear RN,    Please:    1. Create Telephone encounter for this patient.  2. Share with the patient the attached results. The bone lesion was seen on MRI. 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    Plan:  1. No further tests need to be done. .    2. Type of Outreach: 1 phone call and if unable to reach send letter    3. Document the conversation in the Telephone Encounter and close the encounter, no need to send back to me.     Thank you,  Joaquin Bend, PA-C

## 2023-11-23 IMAGING — MR RM - MAO (NAO INCLUI PUNHO)
5 of 12 series · 19 of 40 positions shown · non-contrast
Comparison: none

[Series 9: T1 · coronal · 3.0mm · 0.39mm/px · 2 of 16 slices shown]
[im 1/16]
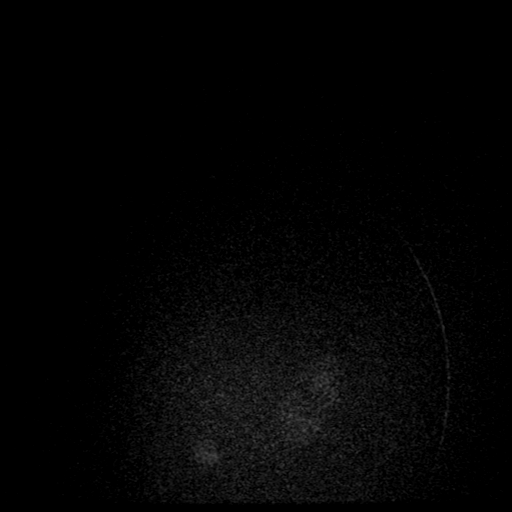
[im 16/16]
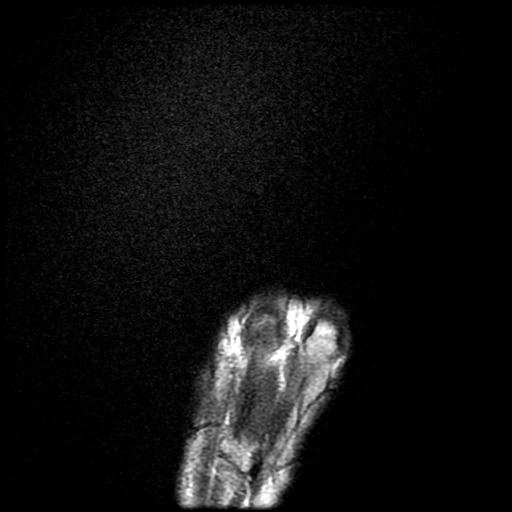

[Series 11: PD fat-sat · coronal · 3.0mm · 0.39mm/px · 2 of 16 slices shown]
[im 1/16]
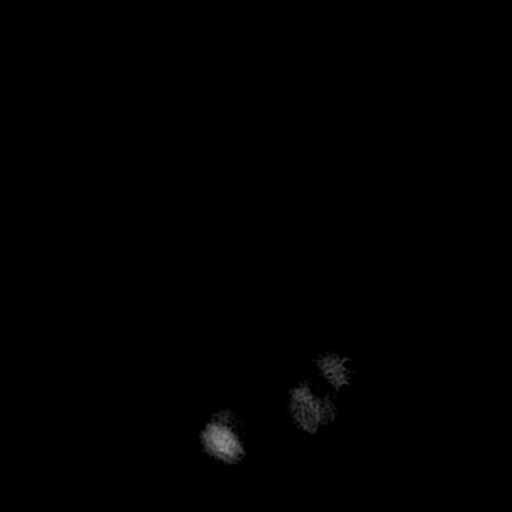
[im 16/16]
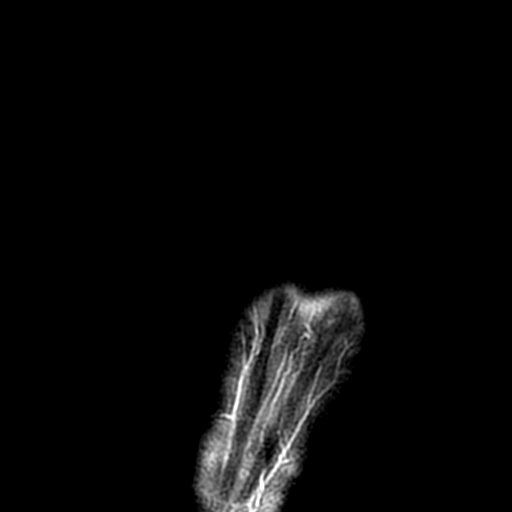

[Series 13: T2 fat-sat · axial · 3.5mm · 0.33mm/px · z∈[-84,+74]mm · 5 of 42 slices shown (1 of 3)]
[im 1/42]
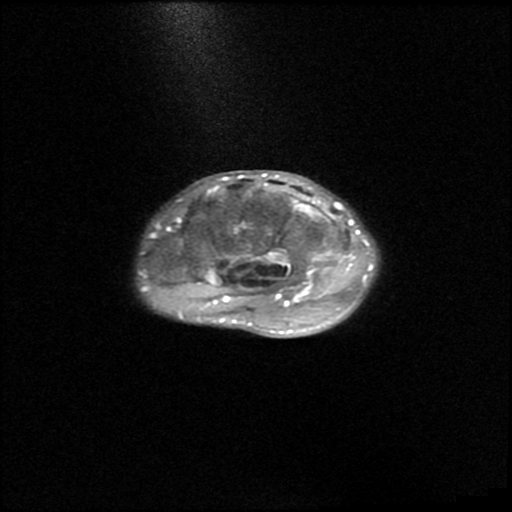
[im 11/42]
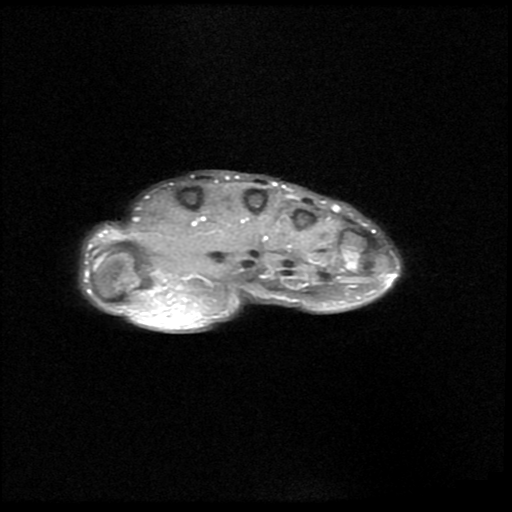
[im 21/42]
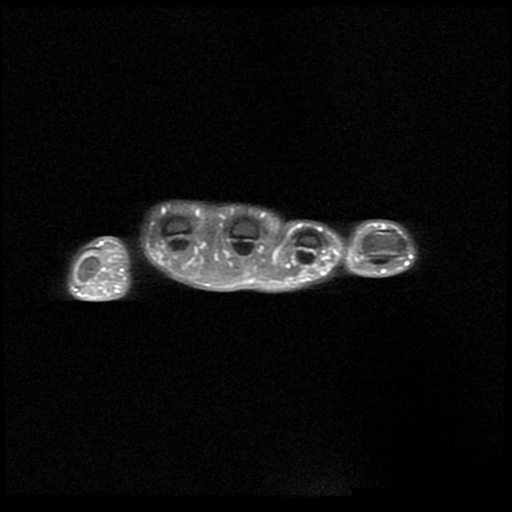
[im 31/42]
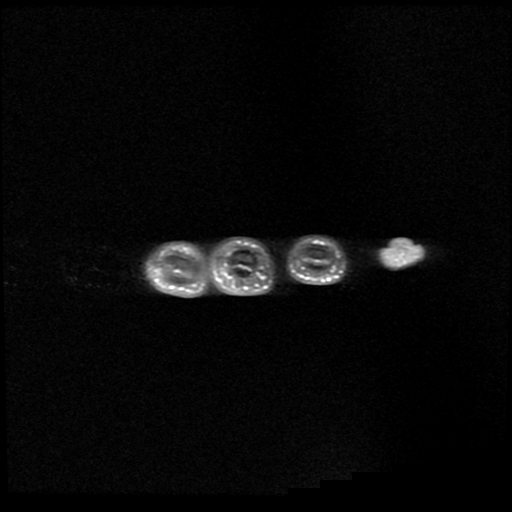
[im 42/42]
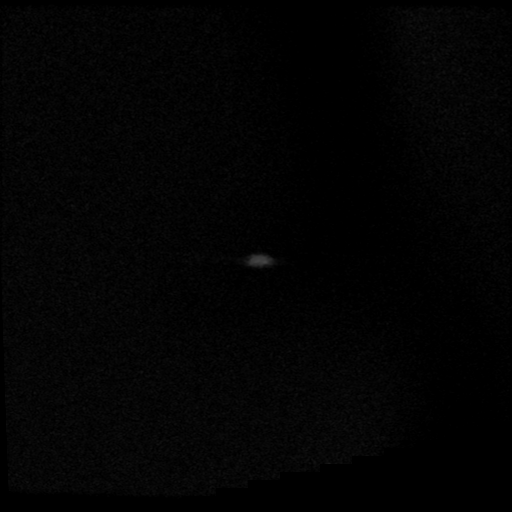

[Series 18: T2 fat-sat · axial · 3.5mm · 0.37mm/px · z∈[-85,+97]mm · 5 of 47 slices shown (2 of 3)]
[im 1/47]
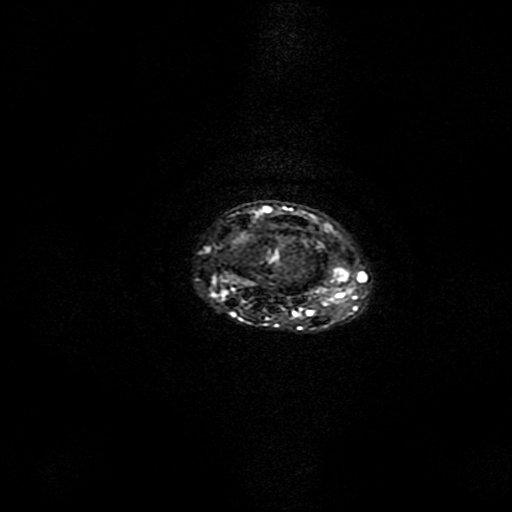
[im 12/47]
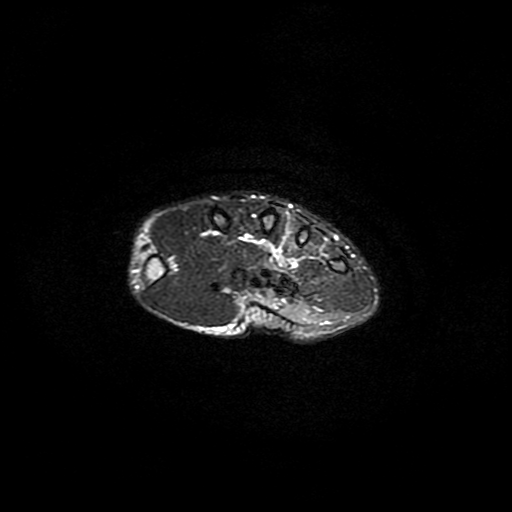
[im 24/47]
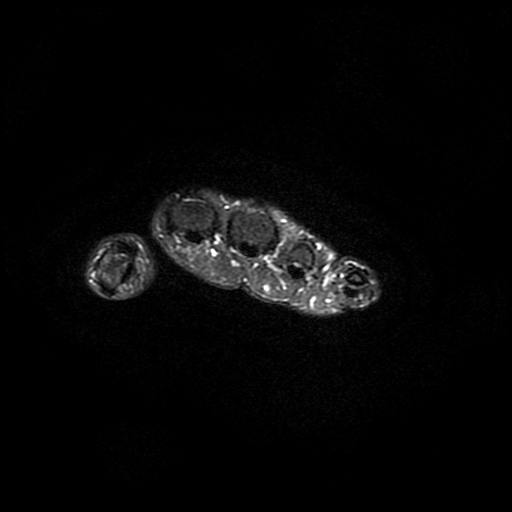
[im 35/47]
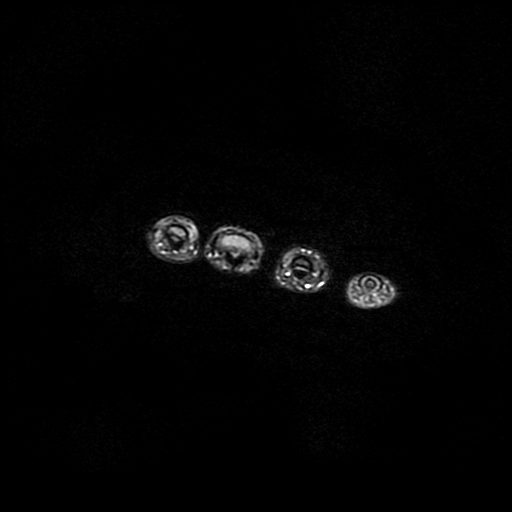
[im 47/47]
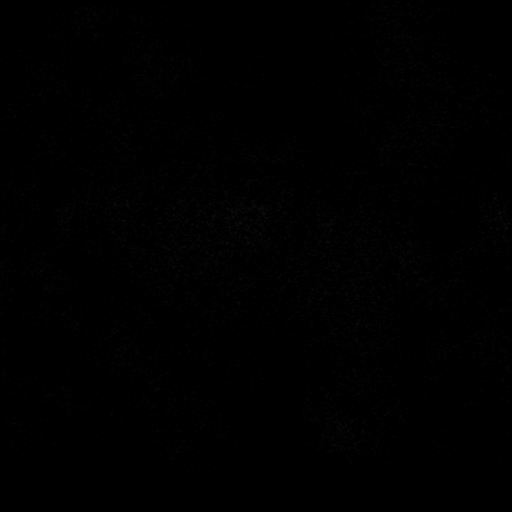

[Series 20: T2 fat-sat · axial · 3.5mm · 0.37mm/px · z∈[-85,+97]mm · 5 of 47 slices shown (3 of 3)]
[im 1/47]
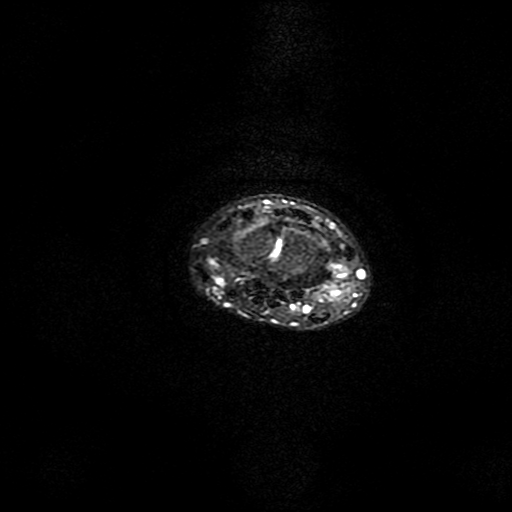
[im 12/47]
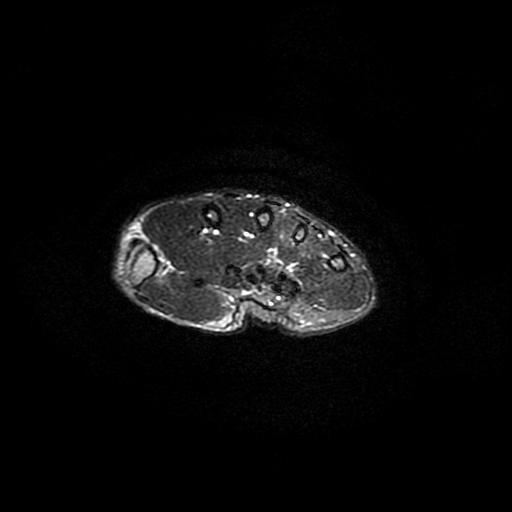
[im 24/47]
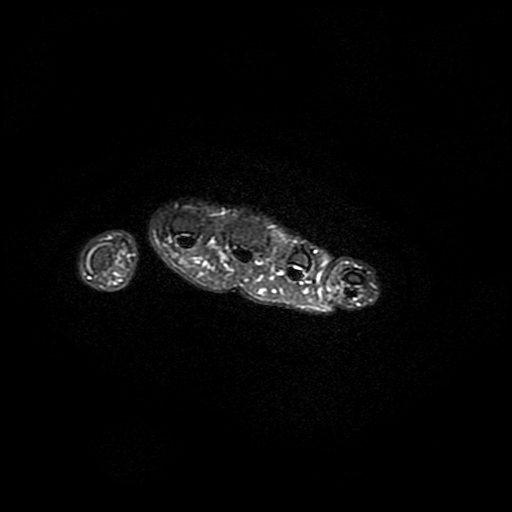
[im 35/47]
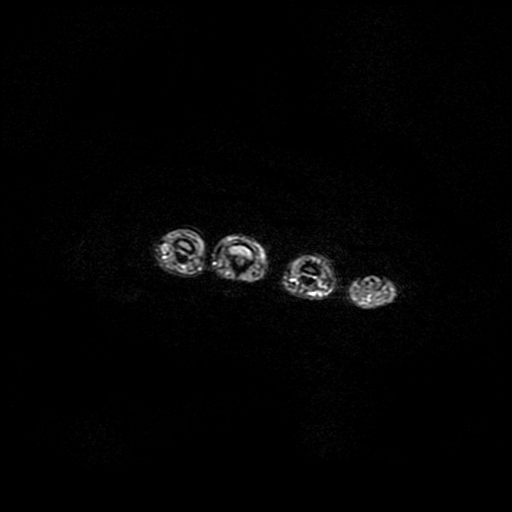
[im 47/47]
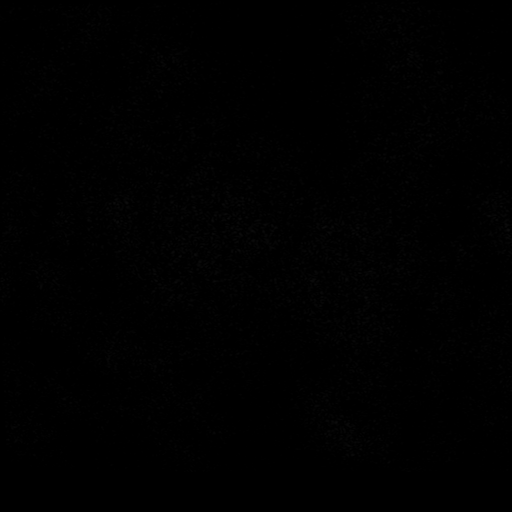

[19 of 40 positions shown; findings below may reference images not displayed]

Informação adicional: -
Médico: -
Técnica:
Exame realizado com sequências multiplanares ponderadas em T1, T2/DP e T2 com supressão de gordura.
Foi administrado meio de contraste paramagnético (gadolínio) por via intravenosa.
Relatório:
Exame prejudicado tecnicamente devido à movimentação do paciente durante a aquisição das imagens.
Edema de aspecto pós-contusional na cabeça do V metacarpo e na diáfise do metacarpo do polegar.
Sinais de artrose nas articulações interfalângicas, caracterizados por redução dos espaços articulares e
RESSONÂNCIA MAGNÉTICA DA MÃO DIREITA
osteófitos marginais.
Sinais de rizartrose, caracterizados por redução do espaço articular, osteófitos marginais e cistos subcondrais
na articulação carpometacárpica do polegar.
Focos de edema ósseo nos ossos do carpo, associados a edema de partes moles, possivelmente relacionados
à atração pós-contusional.
Feixes vasculares e neurais com aspecto habitual, apresentando trajetos livres.
Não há sinais que sugiram rotura das polias digitais.
Fáscia palmar com espessura mantida e sem anormalidades.
Impressão:
Artrose nas articulações interfalângicas.
Rizartrose na articulação carpometacárpica do polegar.
Edema ósseo e de partes moles, possivelmente pós-contusional.
[facility] - [address] [place] [id number], [place] Meffre
